# Patient Record
Sex: Male | Born: 1985 | Race: Black or African American | Hispanic: No | Marital: Married | Smoking: Never smoker
Health system: Southern US, Community
[De-identification: ages and names within clinical notes are randomized; demographics above are authoritative.]

## PROBLEM LIST (undated history)

## (undated) DIAGNOSIS — Z789 Other specified health status: Secondary | ICD-10-CM

## (undated) HISTORY — PX: KNEE ARTHROSCOPY: SUR90

---

## 2012-08-05 ENCOUNTER — Emergency Department (HOSPITAL_COMMUNITY): Payer: BC Managed Care – PPO

## 2012-08-05 ENCOUNTER — Encounter (HOSPITAL_COMMUNITY): Payer: Self-pay | Admitting: Emergency Medicine

## 2012-08-05 ENCOUNTER — Observation Stay (HOSPITAL_COMMUNITY)
Admission: EM | Admit: 2012-08-05 | Discharge: 2012-08-06 | Disposition: A | Discharge status: Home / Self Care | Payer: BC Managed Care – PPO | Attending: Internal Medicine | Admitting: Internal Medicine

## 2012-08-05 DIAGNOSIS — R197 Diarrhea, unspecified: Secondary | ICD-10-CM | POA: Diagnosis present

## 2012-08-05 DIAGNOSIS — R109 Unspecified abdominal pain: Secondary | ICD-10-CM

## 2012-08-05 DIAGNOSIS — R112 Nausea with vomiting, unspecified: Secondary | ICD-10-CM | POA: Diagnosis present

## 2012-08-05 DIAGNOSIS — R1115 Cyclical vomiting syndrome unrelated to migraine: Principal | ICD-10-CM | POA: Insufficient documentation

## 2012-08-05 DIAGNOSIS — R209 Unspecified disturbances of skin sensation: Secondary | ICD-10-CM | POA: Insufficient documentation

## 2012-08-05 HISTORY — DX: Other specified health status: Z78.9

## 2012-08-05 LAB — CBC WITH DIFFERENTIAL/PLATELET
Basophils Absolute: 0 10*3/uL (ref 0.0–0.1)
Basophils Relative: 0 % (ref 0–1)
Eosinophils Absolute: 0 10*3/uL (ref 0.0–0.7)
Hemoglobin: 16.3 g/dL (ref 13.0–17.0)
MCH: 31.3 pg (ref 26.0–34.0)
MCHC: 36.1 g/dL — ABNORMAL HIGH (ref 30.0–36.0)
Monocytes Relative: 5 % (ref 3–12)
Neutro Abs: 4.8 10*3/uL (ref 1.7–7.7)
Neutrophils Relative %: 77 % (ref 43–77)
RDW: 13.2 % (ref 11.5–15.5)

## 2012-08-05 LAB — COMPREHENSIVE METABOLIC PANEL
AST: 24 U/L (ref 0–37)
Albumin: 4.7 g/dL (ref 3.5–5.2)
Alkaline Phosphatase: 68 U/L (ref 39–117)
BUN: 14 mg/dL (ref 6–23)
Chloride: 100 mEq/L (ref 96–112)
Creatinine, Ser: 0.91 mg/dL (ref 0.50–1.35)
Potassium: 3.4 mEq/L — ABNORMAL LOW (ref 3.5–5.1)
Total Protein: 8.2 g/dL (ref 6.0–8.3)

## 2012-08-05 LAB — LIPASE, BLOOD: Lipase: 19 U/L (ref 11–59)

## 2012-08-05 MED ORDER — HYDROMORPHONE HCL PF 1 MG/ML IJ SOLN: 1.0000 mg | INTRAMUSCULAR | Status: AC | PRN

## 2012-08-05 MED ORDER — ONDANSETRON HCL 4 MG PO TABS: 4.0000 mg | ORAL_TABLET | Freq: Four times a day (QID) | ORAL | Status: DC | PRN

## 2012-08-05 MED ORDER — ONDANSETRON HCL 4 MG/2ML IJ SOLN: 4.0000 mg | Freq: Four times a day (QID) | INTRAMUSCULAR | Status: DC | PRN

## 2012-08-05 MED ORDER — SODIUM CHLORIDE 0.9 % IV SOLN
INTRAVENOUS | Status: AC
  Administered 2012-08-05: 17:00:00 via INTRAVENOUS

## 2012-08-05 MED ORDER — ONDANSETRON 8 MG PO TBDP
8.0000 mg | ORAL_TABLET | Freq: Once | ORAL | Status: AC
  Administered 2012-08-05: 8 mg via ORAL
  Filled 2012-08-05: qty 1

## 2012-08-05 MED ORDER — ONDANSETRON HCL 4 MG/2ML IJ SOLN: 4.0000 mg | Freq: Three times a day (TID) | INTRAMUSCULAR | Status: DC | PRN

## 2012-08-05 MED ORDER — SODIUM CHLORIDE 0.9 % IV BOLUS (SEPSIS)
1000.0000 mL | Freq: Once | INTRAVENOUS | Status: AC
  Administered 2012-08-05: 1000 mL via INTRAVENOUS

## 2012-08-05 MED ORDER — ENOXAPARIN SODIUM 40 MG/0.4ML ~~LOC~~ SOLN
40.0000 mg | SUBCUTANEOUS | Status: DC
  Administered 2012-08-05: 40 mg via SUBCUTANEOUS
  Filled 2012-08-05 (×2): qty 0.4

## 2012-08-05 MED ORDER — PROMETHAZINE HCL 25 MG PO TABS: 25.0000 mg | ORAL_TABLET | Freq: Four times a day (QID) | ORAL | Status: DC | PRN

## 2012-08-05 MED ORDER — ONDANSETRON HCL 4 MG/2ML IJ SOLN
4.0000 mg | Freq: Once | INTRAMUSCULAR | Status: AC
  Administered 2012-08-05: 4 mg via INTRAVENOUS
  Filled 2012-08-05: qty 2

## 2012-08-05 MED ORDER — OXYCODONE-ACETAMINOPHEN 5-325 MG PO TABS: ORAL_TABLET | ORAL | Status: DC

## 2012-08-05 MED ORDER — IOHEXOL 300 MG/ML  SOLN
100.0000 mL | Freq: Once | INTRAMUSCULAR | Status: AC | PRN
  Administered 2012-08-05: 100 mL via INTRAVENOUS

## 2012-08-05 MED ORDER — PROMETHAZINE HCL 25 MG/ML IJ SOLN
25.0000 mg | Freq: Once | INTRAMUSCULAR | Status: AC
  Administered 2012-08-05: 25 mg via INTRAVENOUS
  Filled 2012-08-05: qty 1

## 2012-08-05 MED ORDER — POTASSIUM CHLORIDE IN NACL 20-0.9 MEQ/L-% IV SOLN
INTRAVENOUS | Status: DC
  Administered 2012-08-05: 18:00:00 via INTRAVENOUS
  Administered 2012-08-06: 100 mL/h via INTRAVENOUS
  Filled 2012-08-05 (×4): qty 1000

## 2012-08-05 MED ORDER — ALBUTEROL SULFATE (5 MG/ML) 0.5% IN NEBU: 2.5000 mg | INHALATION_SOLUTION | RESPIRATORY_TRACT | Status: AC | PRN

## 2012-08-05 MED ORDER — LORAZEPAM 2 MG/ML IJ SOLN
1.0000 mg | Freq: Once | INTRAMUSCULAR | Status: AC
  Administered 2012-08-05: 1 mg via INTRAVENOUS
  Filled 2012-08-05: qty 1

## 2012-08-05 MED ORDER — HYDROMORPHONE HCL PF 1 MG/ML IJ SOLN
1.0000 mg | Freq: Once | INTRAMUSCULAR | Status: AC
  Administered 2012-08-05: 1 mg via INTRAVENOUS
  Filled 2012-08-05: qty 1

## 2012-08-05 MED ORDER — IOHEXOL 300 MG/ML  SOLN
50.0000 mL | Freq: Once | INTRAMUSCULAR | Status: AC | PRN
  Administered 2012-08-05: 50 mL via ORAL

## 2012-08-05 NOTE — ED Notes (Signed)
Pt vomited contrast, CT aware

## 2012-08-05 NOTE — ED Notes (Signed)
Pt drinking contrast. 

## 2012-08-05 NOTE — H&P (Signed)
[facility] History and Physical  Narayan Scull ONG:[id number] DOB: 05/19/1985 DOA: 08/05/2012  Referring physician: Dr. Denton Lank PCP: No primary provider on file.  Specialists: none  Chief Complaint: nausea/vomiting/diarrhea  HPI: Dustin Torres is a 27 y.o. male without past medical history and on no chronic medications presents to the ED with a chief complaint of sudden onset of nausea vomiting abdominal pain and diarrhea that started this morning. He endorses several episodes of vomiting, and he has started noticing at one point streaks of blood. His diarrhea is watery, without any blood. He endorses abdominal pain bilateral lower quadrants. He endorses subjective fevers, and chills diffuse muscle aches and fatigue. Denies any lightheadedness or dizziness, denies any headaches, has no chest pain, shortness of breath, denies any pain with urination, and denies any leg swelling. Denies any sick contacts. He is on city water. Denies any exotic or new foods. In the ED patient underwent a CT scan of the abdomen and pelvis which was unremarkable. Patient was given a trial of by mouth intake in the ED, and was unable to hold anything down, and [facility] was called for admission for intractable nausea vomiting, and severe abdominal pain.  Review of Systems: As per history of present illness, otherwise negative  Past Medical History  Diagnosis Date  . Medical history non-contributory    Past Surgical History  Procedure Laterality Date  . Knee arthroscopy      2 surgeries on the left, 1 surgery on the right   Social History:  reports that he has never smoked. He has never used smokeless tobacco. He reports that he does not drink alcohol or use illicit drugs.  No Known Allergies  Family history - Positive for his sister dying about 6 years ago from heart disease. This was in the setting of pregnancy. He remembers her having an "enlarged heart".  Prior to Admission medications   Medication Sig  Start Date End Date Taking? Authorizing Provider  oxyCODONE-acetaminophen (PERCOCET/ROXICET) 5-325 MG per tablet 1 to 2 tabs PO q6hrs  PRN for pain 08/05/12   Joni Reining Pisciotta, PA-C  promethazine (PHENERGAN) 25 MG tablet Take 1 tablet (25 mg total) by mouth every 6 (six) hours as needed for nausea. 08/05/12   Wynetta Emery, PA-C   Physical Exam: Filed Vitals:   08/05/12 1052 08/05/12 1228 08/05/12 1608  BP: 134/80  130/67  Pulse: 92  65  Temp: 97.7 F (36.5 C) 98.5 F (36.9 C) 97.6 F (36.4 C)  TempSrc: Oral Rectal Oral  Resp: 16  18  SpO2: 100%  100%    General:  In distress due to abdominal pain.  Eyes: PERRL, EOMI, no scleral icterus  ENT: moist oropharynx  Neck: supple, no JVD  Cardiovascular: regular rate without MRG; 2+ peripheral pulses  Respiratory: CTA biL, good air movement without wheezing, rhonchi or crackled  Abdomen: soft, tender to palpation bilateral lower quadrants, positive bowel sounds, no guarding, no rebound  Skin: no rashes  Musculoskeletal: no peripheral edema  Psychiatric: normal mood and affect  Neurologic: CN 2-12 grossly intact, MS 5/5 in all 4  Labs on Admission:  Basic Metabolic Panel:  Recent Labs Lab 08/05/12 1112  NA 137  K 3.4*  CL 100  CO2 17*  GLUCOSE 159*  BUN 14  CREATININE 0.91  CALCIUM 10.0   Liver Function Tests:  Recent Labs Lab 08/05/12 1112  AST 24  ALT 23  ALKPHOS 68  BILITOT 0.5  PROT 8.2  ALBUMIN 4.7    Recent  Labs Lab 08/05/12 1112  LIPASE 19   CBC:  Recent Labs Lab 08/05/12 1112  WBC 6.2  NEUTROABS 4.8  HGB 16.3  HCT 45.1  MCV 86.6  PLT 231   Radiological Exams on Admission: Ct Abdomen Pelvis W Contrast  08/05/2012   *RADIOLOGY REPORT*  Clinical Data: Abdominal pain, nausea, vomiting.  CT ABDOMEN AND PELVIS WITH CONTRAST  Technique:  Multidetector CT imaging of the abdomen and pelvis was performed following the standard protocol during bolus administration of intravenous contrast.   Contrast:  OMNIPAQUE IOHEXOL 300 MG/ML  SOLN  Comparison: None.  Findings: Lung bases are clear.  No effusions.  Heart is normal size.  Liver, gallbladder, spleen, pancreas, adrenals and kidneys are normal.  There is a small amount of free fluid in the pelvis.  Appendix is visualized and is normal.  Stomach, large and small bowel grossly unremarkable.  No free air or adenopathy.  Aorta is normal caliber. No acute bony abnormality.  IMPRESSION: Small amount of free fluid in the pelvis, etiology uncertain.  No visible inflammatory process in the abdomen or pelvis.  Appendix is normal.   Original Report Authenticated By: Charlett Nose, M.D.   Assessment/Plan Active Problems:   Intractable nausea and vomiting   Diarrhea  Abdominal pain with nausea vomiting diarrhea With an uncomplicated CT scan of the abdomen, this is likely viral gastroenteritis. Will admit for observation, IV fluids and potassium repletion. Supportive treatment with antinausea medications and pain control.  DVT prophylaxis Lovenox  Code Status: Presumed full code  Family Communication: Friend in the room  Disposition Plan: Observation, home when medically ready  Time spent: 17  Costin M. Elvera Lennox, MD [facility] Pager [phone]  If 7PM-7AM, please contact night-coverage [email or web address] Password [facility] 08/05/2012, 4:45 PM

## 2012-08-05 NOTE — ED Notes (Signed)
Pt c/o NVD, mid abd pain since about 0730 this morning.  Also c/o numbness and tingling in his extremities.

## 2012-08-05 NOTE — ED Notes (Signed)
Pt to be discharged when fluids are almost complete.

## 2012-08-05 NOTE — Progress Notes (Signed)
Utilization Review completed.  Teosha Casso RN CM  

## 2012-08-05 NOTE — ED Provider Notes (Signed)
History     CSN: [id number]  Arrival date & time 08/05/12  1037   First MD Initiated Contact with Patient 08/05/12 1126      Chief Complaint  Patient presents with  . Nausea  . Emesis  . Diarrhea  . Abdominal Pain  . Numbness    (Consider location/radiation/quality/duration/timing/severity/associated sxs/prior treatment) HPI  Dustin Torres is a 27 y.o. male complaining of acute onset of lower abdominal pain followed by nonbloody, nonbilious, non-coffee-ground emesis and watery diarrhea approximately 8 AM. Associated symptoms of fever, chills, upper extremity paresthesia. Patient rates his pain at 10/10, no exacerbating or alleviating factors identified. Pain started before nausea vomiting or diarrhea. He denies chest pain, shortness of breath, change in bladder habits, or urethral discharge.  History reviewed. No pertinent past medical history.  History reviewed. No pertinent past surgical history.  History reviewed. No pertinent family history.  History  Substance Use Topics  . Smoking status: Never Smoker   . Smokeless tobacco: Not on file  . Alcohol Use: No      Review of Systems  Constitutional: Positive for fever and chills.  Respiratory: Negative for shortness of breath.   Cardiovascular: Negative for chest pain.  Gastrointestinal: Positive for nausea, vomiting, abdominal pain and diarrhea.  All other systems reviewed and are negative.    Allergies  Review of patient's allergies indicates no known allergies.  Home Medications  No current outpatient prescriptions on file.  BP 134/80  Pulse 92  Temp(Src) 97.7 F (36.5 C) (Oral)  Resp 16  SpO2 100%  Physical Exam  Nursing note and vitals reviewed. Constitutional: He is oriented to person, place, and time. He appears well-developed and well-nourished.  Appears acutely ill, eyes closed, Stays very still  HENT:  Head: Normocephalic.  Mouth/Throat: Oropharynx is clear and moist.  Eyes: Conjunctivae  and EOM are normal. Pupils are equal, round, and reactive to light.  Neck: Normal range of motion.  Cardiovascular: Normal rate.   Pulmonary/Chest: Effort normal. No stridor.  Abdominal: Soft. He exhibits no distension and no mass. There is tenderness. There is no rebound and no guarding.  Bowel sounds are present but reduced. Patient has tender to palpation in the right lower and left lower quadrant. Voluntary guarding, no rebound.  Musculoskeletal: Normal range of motion.  Neurological: He is alert and oriented to person, place, and time.  Psychiatric: He has a normal mood and affect.    ED Course  Procedures (including critical care time)  Labs Reviewed  CBC WITH DIFFERENTIAL - Abnormal; Notable for the following:    MCHC 36.1 (*)    All other components within normal limits  COMPREHENSIVE METABOLIC PANEL - Abnormal; Notable for the following:    Potassium 3.4 (*)    CO2 17 (*)    Glucose, Bld 159 (*)    All other components within normal limits  LIPASE, BLOOD   Ct Abdomen Pelvis W Contrast  08/05/2012   *RADIOLOGY REPORT*  Clinical Data: Abdominal pain, nausea, vomiting.  CT ABDOMEN AND PELVIS WITH CONTRAST  Technique:  Multidetector CT imaging of the abdomen and pelvis was performed following the standard protocol during bolus administration of intravenous contrast.  Contrast:  OMNIPAQUE IOHEXOL 300 MG/ML  SOLN  Comparison: None.  Findings: Lung bases are clear.  No effusions.  Heart is normal size.  Liver, gallbladder, spleen, pancreas, adrenals and kidneys are normal.  There is a small amount of free fluid in the pelvis.  Appendix is visualized and is normal.  Stomach, large and small bowel grossly unremarkable.  No free air or adenopathy.  Aorta is normal caliber. No acute bony abnormality.  IMPRESSION: Small amount of free fluid in the pelvis, etiology uncertain.  No visible inflammatory process in the abdomen or pelvis.  Appendix is normal.   Original Report Authenticated  By: Charlett Nose, M.D.    12:25 PM patient seen and examined at the bedside, he is resting comfortably however he is sweating. Patient rates his pain is improved, 4/10 now with reduction in nausea as well.  3:25 PM Fails PO challnege. Pain is 7/10, refuses pain meds.   1. Abdominal pain   2. Nausea vomiting and diarrhea       MDM   Filed Vitals:   08/05/12 1052  BP: 134/80  Pulse: 92  Temp: 97.7 F (36.5 C)  TempSrc: Oral  Resp: 16  SpO2: 100%     [name] is a 27 y.o. male with nausea vomiting diarrhea and abdominal pain. Patient is diffusely tender to palpation no focal abdominal tenderness however he appears very uncomfortable and ordering a CT for this reason. Blood work is unremarkable.  Serial abdominal exams remained nonsurgical.   CT shows small amount of free fluid with no other abnormalities.  Patient has failed a by mouth challenge of discharge. It is recent bring him in for intractable vomiting.  Patient will be admitted to a MedSurg bed for observation under care of Dr. Lafe Garin.    Medications  promethazine (PHENERGAN) injection 25 mg (not administered)  0.9 %  sodium chloride infusion (not administered)  ondansetron (ZOFRAN) injection 4 mg (not administered)  HYDROmorphone (DILAUDID) injection 1 mg (not administered)  albuterol (PROVENTIL) (5 MG/ML) 0.5% nebulizer solution 2.5 mg (not administered)  ondansetron (ZOFRAN-ODT) disintegrating tablet 8 mg (8 mg Oral Given 08/05/12 1102)  ondansetron (ZOFRAN) injection 4 mg (4 mg Intravenous Given 08/05/12 1156)  sodium chloride 0.9 % bolus 1,000 mL (0 mLs Intravenous Stopped 08/05/12 1253)  HYDROmorphone (DILAUDID) injection 1 mg (1 mg Intravenous Given 08/05/12 1156)  iohexol (OMNIPAQUE) 300 MG/ML solution 50 mL (50 mLs Oral Contrast Given 08/05/12 1251)  iohexol (OMNIPAQUE) 300 MG/ML solution 100 mL (100 mLs Intravenous Contrast Given 08/05/12 1409)  LORazepam (ATIVAN) injection 1 mg (1 mg Intravenous Given  08/05/12 1533)  sodium chloride 0.9 % bolus 1,000 mL (1,000 mLs Intravenous New Bag/Given 08/05/12 1535)     Wynetta Emery, PA-C 08/05/12 1619

## 2012-08-06 LAB — BASIC METABOLIC PANEL
CO2: 26 mEq/L (ref 19–32)
Calcium: 8.8 mg/dL (ref 8.4–10.5)
Creatinine, Ser: 1.03 mg/dL (ref 0.50–1.35)
GFR calc Af Amer: >90 mL/min (ref >90)
GFR calc non Af Amer: >90 mL/min (ref >90)

## 2012-08-06 LAB — CBC
MCH: 29.3 pg (ref 26.0–34.0)
MCHC: 33.3 g/dL (ref 30.0–36.0)
MCV: 88.2 fL (ref 78.0–100.0)
Platelets: 187 10*3/uL (ref 150–400)
RDW: 13.9 % (ref 11.5–15.5)

## 2012-08-06 MED ORDER — PROMETHAZINE HCL 25 MG PO TABS: 25.0000 mg | ORAL_TABLET | Freq: Four times a day (QID) | ORAL | Status: DC | PRN

## 2012-08-06 MED ORDER — OXYCODONE-ACETAMINOPHEN 5-325 MG PO TABS: ORAL_TABLET | ORAL | Status: DC

## 2012-08-06 NOTE — ED Provider Notes (Signed)
Medical screening examination/treatment/procedure(s) were performed by non-physician practitioner and as supervising physician I was immediately available for consultation/collaboration.   Annaleigha Woo E Glendal Cassaday, MD 08/06/12 1107 

## 2012-08-06 NOTE — Discharge Summary (Signed)
Physician Discharge Summary  Dustin Torres HQI:[id number] DOB: 27-Feb-1986 DOA: 08/05/2012  PCP: No primary provider on file.  Admit date: 08/05/2012 Discharge date: 08/06/2012  Recommendations for Outpatient Follow-up:  1. Pt will need to follow up with PCP in 2-3 weeks post discharge 2. Please obtain BMP to evaluate electrolytes and kidney function 3. Please also check CBC to evaluate Hg and Hct levels  Discharge Diagnoses:  Active Problems:   Intractable nausea and vomiting   Diarrhea   Discharge Condition: Stable  Diet recommendation: Heart healthy diet discussed in details   History of present illness:  Dustin Torres is a 27 y.o. male without past medical history and on no chronic medications presents to the ED with a chief complaint of sudden onset of nausea vomiting abdominal pain and diarrhea that started the morning of admission. He endorses several episodes of vomiting, and he has started noticing at one point streaks of blood. His diarrhea is watery, without any blood. He endorses abdominal pain, bilateral lower quadrants. He endorses subjective fevers, and chills diffuse muscle aches and fatigue. Denies any lightheadedness or dizziness, denies any headaches, has no chest pain, shortness of breath, denies any pain with urination, and denies any leg swelling. Denies any sick contacts. He is on city water. Denies any exotic or new foods. In the ED patient underwent a CT scan of the abdomen and pelvis which was unremarkable. Patient was given a trial of by mouth intake in the ED, and was unable to hold anything down, and [facility] was called for admission for intractable nausea vomiting, and severe abdominal pain.   Hospital Course:  Active Problems:   Intractable nausea and vomiting   Diarrhea - likely secondary to acute viral gastroenteritis - pt feeling better this am, toleration PO intake well - all electrolytes stable and within normal limits - pt wants to be discharged home    Procedures/Studies: Ct Abdomen Pelvis W Contrast  08/05/2012   *RADIOLOGY REPORT*  Clinical Data: Abdominal pain, nausea, vomiting.  CT ABDOMEN AND PELVIS WITH CONTRAST  Technique:  Multidetector CT imaging of the abdomen and pelvis was performed following the standard protocol during bolus administration of intravenous contrast.  Contrast:  OMNIPAQUE IOHEXOL 300 MG/ML  SOLN  Comparison: None.  Findings: Lung bases are clear.  No effusions.  Heart is normal size.  Liver, gallbladder, spleen, pancreas, adrenals and kidneys are normal.  There is a small amount of free fluid in the pelvis.  Appendix is visualized and is normal.  Stomach, large and small bowel grossly unremarkable.  No free air or adenopathy.  Aorta is normal caliber. No acute bony abnormality.  IMPRESSION: Small amount of free fluid in the pelvis, etiology uncertain.  No visible inflammatory process in the abdomen or pelvis.  Appendix is normal.   Original Report Authenticated By: Charlett Nose, M.D.   Consultations:  None  Antibiotics:  None  Discharge Exam: Filed Vitals:   08/06/12 0623  BP: 126/65  Pulse: 78  Temp: 98.5 F (36.9 C)  Resp: 18   Filed Vitals:   08/05/12 1608 08/05/12 1730 08/05/12 2120 08/06/12 0623  BP: 130/67 143/74 127/59 126/65  Pulse: 65 72 76 78  Temp: 97.6 F (36.4 C) 98 F (36.7 C) 99 F (37.2 C) 98.5 F (36.9 C)  TempSrc: Oral Oral Oral Oral  Resp: 18 20 18 18   Height:  6' 4.5" (1.943 m)    Weight:  122.471 kg (270 lb)    SpO2: 100% 93% 100% 100%  General: Pt is alert, follows commands appropriately, not in acute distress Cardiovascular: Regular rate and rhythm, S1/S2 +, no murmurs, no rubs, no gallops Respiratory: Clear to auscultation bilaterally, no wheezing, no crackles, no rhonchi Abdominal: Soft, non tender, non distended, bowel sounds +, no guarding Extremities: no edema, no cyanosis, pulses palpable bilaterally DP and PT Neuro: Grossly nonfocal  Discharge  Instructions  Discharge Orders   Future Orders Complete By Expires     Diet - low sodium heart healthy  As directed     Increase activity slowly  As directed         Medication List    TAKE these medications       oxyCODONE-acetaminophen 5-325 MG per tablet  Commonly known as:  PERCOCET/ROXICET  1 to 2 tabs PO q6hrs  PRN for pain     promethazine 25 MG tablet  Commonly known as:  PHENERGAN  Take 1 tablet (25 mg total) by mouth every 6 (six) hours as needed for nausea.           Follow-up Information   Follow up with [facility] (If symptoms worsen)    Contact information:   [address] [id number] [place] [address] [phone]      Follow up with [facility].   Contact information:   [address] [id number] [place] [address] [phone]       The results of significant diagnostics from this hospitalization (including imaging, microbiology, ancillary and laboratory) are listed below for reference.     Microbiology: No results found for this or any previous visit (from the past 240 hour(s)).   Labs: Basic Metabolic Panel:  Recent Labs Lab 08/05/12 1112 08/06/12 0547  NA 137 138  K 3.4* 3.6  CL 100 104  CO2 17* 26  GLUCOSE 159* 97  BUN 14 9  CREATININE 0.91 1.03  CALCIUM 10.0 8.8   Liver Function Tests:  Recent Labs Lab 08/05/12 1112  AST 24  ALT 23  ALKPHOS 68  BILITOT 0.5  PROT 8.2  ALBUMIN 4.7    Recent Labs Lab 08/05/12 1112  LIPASE 19   No results found for this basename: AMMONIA,  in the last 168 hours CBC:  Recent Labs Lab 08/05/12 1112 08/06/12 0547  WBC 6.2 8.4  NEUTROABS 4.8  --   HGB 16.3 13.4  HCT 45.1 40.3  MCV 86.6 88.2  PLT 231 187   SIGNED: Time coordinating discharge: Over 30 minutes  Debbora Presto, MD  [facility] 08/06/2012, 12:52 PM Pager [phone]  If 7PM-7AM, please contact  night-coverage [email or web address] Password [email or web address]

## 2012-08-06 NOTE — Progress Notes (Signed)
Nutrition Brief Note  Patient identified on the Malnutrition Screening Tool (MST) Report  Body mass index is 32.44 kg/(m^2). Patient meets criteria for class I obesity based on current BMI.   Current diet order is regular which will start at lunch, patient consuming >50% of clear liquid breakfast tray with no nausea this morning, expect pt will eat well on regular diet at lunch. Labs and medications reviewed. Met with pt who reports having nausea and vomiting that started yesterday, reports having at least 10-15 episodes of vomiting. Pt denies eating anything out of the ordinary. Pt reports good appetite and following a healthy diet PTA. Pt reports he has gradually lost over 100 pounds in the past 4 years since graduating from college by working out. Pt denies any nausea/vomiting today and tolerated clear liquid diet. Encouraged pt to consume bland foods on regular diet.   No nutrition interventions warranted at this time. If nutrition issues arise, please consult RD.   Levon Hedger MS, RD, LDN [phone] Pager [phone] After Hours Pager

## 2013-01-03 ENCOUNTER — Emergency Department (HOSPITAL_COMMUNITY)
Admission: EM | Admit: 2013-01-03 | Discharge: 2013-01-03 | Disposition: A | Discharge status: Home / Self Care | Payer: BC Managed Care – PPO | Attending: Emergency Medicine | Admitting: Emergency Medicine

## 2013-01-03 ENCOUNTER — Encounter (HOSPITAL_COMMUNITY): Payer: Self-pay | Admitting: Emergency Medicine

## 2013-01-03 DIAGNOSIS — A084 Viral intestinal infection, unspecified: Secondary | ICD-10-CM

## 2013-01-03 DIAGNOSIS — R42 Dizziness and giddiness: Secondary | ICD-10-CM | POA: Insufficient documentation

## 2013-01-03 DIAGNOSIS — A088 Other specified intestinal infections: Secondary | ICD-10-CM | POA: Insufficient documentation

## 2013-01-03 LAB — DIFFERENTIAL
Lymphs Abs: 0.7 10*3/uL (ref 0.7–4.0)
Monocytes Relative: 4 % (ref 3–12)
Neutro Abs: 4.9 10*3/uL (ref 1.7–7.7)
Neutrophils Relative %: 84 % — ABNORMAL HIGH (ref 43–77)

## 2013-01-03 LAB — COMPREHENSIVE METABOLIC PANEL
ALT: 17 U/L (ref 0–53)
Calcium: 9.4 mg/dL (ref 8.4–10.5)
GFR calc Af Amer: >90 mL/min (ref >90)
Glucose, Bld: 135 mg/dL — ABNORMAL HIGH (ref 70–99)
Sodium: 137 mEq/L (ref 135–145)
Total Protein: 7.4 g/dL (ref 6.0–8.3)

## 2013-01-03 LAB — CBC
Hemoglobin: 14.8 g/dL (ref 13.0–17.0)
MCH: 30.1 pg (ref 26.0–34.0)
RBC: 4.91 MIL/uL (ref 4.22–5.81)

## 2013-01-03 MED ORDER — MORPHINE SULFATE 4 MG/ML IJ SOLN
4.0000 mg | Freq: Once | INTRAMUSCULAR | Status: AC
  Administered 2013-01-03: 4 mg via INTRAVENOUS
  Filled 2013-01-03: qty 1

## 2013-01-03 MED ORDER — ONDANSETRON HCL 4 MG/2ML IJ SOLN
4.0000 mg | Freq: Once | INTRAMUSCULAR | Status: AC
  Administered 2013-01-03: 4 mg via INTRAVENOUS
  Filled 2013-01-03: qty 2

## 2013-01-03 MED ORDER — ONDANSETRON HCL 4 MG PO TABS: 4.0000 mg | ORAL_TABLET | Freq: Four times a day (QID) | ORAL | Status: DC

## 2013-01-03 MED ORDER — SODIUM CHLORIDE 0.9 % IV BOLUS (SEPSIS)
1000.0000 mL | Freq: Once | INTRAVENOUS | Status: AC
  Administered 2013-01-03: 1000 mL via INTRAVENOUS

## 2013-01-03 MED ORDER — SODIUM CHLORIDE 0.9 % IV SOLN
1000.0000 mL | Freq: Once | INTRAVENOUS | Status: AC
  Administered 2013-01-03: 1000 mL via INTRAVENOUS

## 2013-01-03 MED ORDER — METOCLOPRAMIDE HCL 5 MG/ML IJ SOLN
10.0000 mg | Freq: Once | INTRAMUSCULAR | Status: AC
  Administered 2013-01-03: 10 mg via INTRAVENOUS
  Filled 2013-01-03: qty 2

## 2013-01-03 MED ORDER — PROMETHAZINE HCL 25 MG/ML IJ SOLN
12.5000 mg | Freq: Four times a day (QID) | INTRAMUSCULAR | Status: DC | PRN
  Administered 2013-01-03: 12.5 mg via INTRAVENOUS
  Filled 2013-01-03: qty 1

## 2013-01-03 NOTE — ED Provider Notes (Signed)
Medical screening examination/treatment/procedure(s) were performed by non-physician practitioner and as supervising physician I was immediately available for consultation/collaboration.    Atanacio Melnyk, MD 01/03/13 2154 

## 2013-01-03 NOTE — ED Notes (Signed)
He states that he has vomited several times this morning; "and I feel real cold".  He is in no distress.  An adult male accompanies him.

## 2013-01-03 NOTE — ED Provider Notes (Signed)
CSN: [id number]     Arrival date & time 01/03/13  1322 History   First MD Initiated Contact with Patient 01/03/13 1325     Chief Complaint  Patient presents with  . Emesis   (Consider location/radiation/quality/duration/timing/severity/associated sxs/prior Treatment) HPI Dustin Torres is a 27 y.o. male who presents emergency department with complaint of nausea, vomiting, diarrhea, abdominal pain, cold chills. Patient states his symptoms began this morning when he woke up. Patient reports that he has had over 10 episodes of emesis, few episodes of watery stools, and having lower abdominal cramping. Patient denies any recent travel, denies any fever, no blood in his stool or his emesis. Patient tried Pepto-Bismol prior to coming in. Patient reports similar episode several months ago for which he ended up being hospitalized for intractable vomiting. Patient denies anyone in the household with the same symptoms. He denies any prior abdominal surgeries.  Past Medical History  Diagnosis Date  . Medical history non-contributory    Past Surgical History  Procedure Laterality Date  . Knee arthroscopy      2 surgeries on the left, 1 surgery on the right   History reviewed. No pertinent family history. History  Substance Use Topics  . Smoking status: Never Smoker   . Smokeless tobacco: Never Used  . Alcohol Use: No    Review of Systems  Constitutional: Positive for chills. Negative for fever.  Respiratory: Negative for cough, chest tightness and shortness of breath.   Cardiovascular: Negative for chest pain, palpitations and leg swelling.  Gastrointestinal: Positive for nausea, vomiting, abdominal pain and diarrhea. Negative for abdominal distention.  Genitourinary: Negative for dysuria, urgency, frequency, hematuria and flank pain.  Musculoskeletal: Negative for arthralgias, myalgias, neck pain and neck stiffness.  Skin: Negative for rash.  Allergic/Immunologic: Negative for  immunocompromised state.  Neurological: Positive for light-headedness. Negative for dizziness, weakness, numbness and headaches.    Allergies  Review of patient's allergies indicates no known allergies.  Home Medications   Current Outpatient Rx  Name  Route  Sig  Dispense  Refill  . bismuth subsalicylate (PEPTO BISMOL) 262 MG chewable tablet   Oral   Chew 524 mg by mouth as needed for indigestion.          BP 146/83  Pulse 67  Temp(Src) 97.9 F (36.6 C) (Oral)  Resp 18  SpO2 100% Physical Exam  Nursing note and vitals reviewed. Constitutional: He appears well-developed and well-nourished.  Uncomfortable appearing, laying with his eyes closed, refuses to move  HENT:  Head: Normocephalic and atraumatic.  Eyes: Conjunctivae are normal.  Neck: Neck supple.  Cardiovascular: Normal rate, regular rhythm and normal heart sounds.   Pulmonary/Chest: Effort normal. No respiratory distress. He has no wheezes. He has no rales.  Abdominal: Soft. Bowel sounds are normal. He exhibits no distension. There is tenderness. There is guarding. There is no rebound.  Diffuse tenderness with mild guarding.  Musculoskeletal: He exhibits no edema.  Neurological: He is alert.  Skin: Skin is warm and dry.    ED Course  Procedures (including critical care time) Labs Review Labs Reviewed  CBC WITH DIFFERENTIAL  COMPREHENSIVE METABOLIC PANEL  LIPASE, BLOOD   Imaging Review No results found.  EKG Interpretation   None       MDM   1. Viral gastroenteritis     Sent with nausea, vomiting, diarrhea, diffuse abdominal tenderness. Chills. He received 4 mg of morphine IV, Reglan, with good relief of nausea and pain. He stated that he still had  a lobe of nausea Phenergan as ordered. He is going to try to drink some ice chest. There is a delay in blood work, it's pending. I do not think at this time on reassessment patient had a surgical abdomen. I do not think any imaging indicated at this  time. Will reassess after oral trial and blood work.   Lottie Mussel, PA-C 01/04/13 2042

## 2013-01-03 NOTE — ED Provider Notes (Signed)
Patient signed out to me by Kirichenko, PA-C.  Patient presents with nausea, vomiting, and diarrhea.    Labs are reassuring.  Results for orders placed during the hospital encounter of 01/03/13  COMPREHENSIVE METABOLIC PANEL      Result Value Range   Sodium 137  135 - 145 mEq/L   Potassium 3.9  3.5 - 5.1 mEq/L   Chloride 101  96 - 112 mEq/L   CO2 22  19 - 32 mEq/L   Glucose, Bld 135 (*) 70 - 99 mg/dL   BUN 10  6 - 23 mg/dL   Creatinine, Ser 1.61  0.50 - 1.35 mg/dL   Calcium 9.4  8.4 - 09.6 mg/dL   Total Protein 7.4  6.0 - 8.3 g/dL   Albumin 4.5  3.5 - 5.2 g/dL   AST 23  0 - 37 U/L   ALT 17  0 - 53 U/L   Alkaline Phosphatase 56  39 - 117 U/L   Total Bilirubin 0.5  0.3 - 1.2 mg/dL   GFR calc non Af Amer >90  >90 mL/min   GFR calc Af Amer >90  >90 mL/min  LIPASE, BLOOD      Result Value Range   Lipase 13  11 - 59 U/L  CBC      Result Value Range   WBC 5.9  4.0 - 10.5 K/uL   RBC 4.91  4.22 - 5.81 MIL/uL   Hemoglobin 14.8  13.0 - 17.0 g/dL   HCT 04.5  40.9 - 81.1 %   MCV 89.0  78.0 - 100.0 fL   MCH 30.1  26.0 - 34.0 pg   MCHC 33.9  30.0 - 36.0 g/dL   RDW 91.4  78.2 - 95.6 %   Platelets 229  150 - 400 K/uL  DIFFERENTIAL      Result Value Range   Neutrophils Relative % 84 (*) 43 - 77 %   Neutro Abs 4.9  1.7 - 7.7 K/uL   Lymphocytes Relative 12  12 - 46 %   Lymphs Abs 0.7  0.7 - 4.0 K/uL   Monocytes Relative 4  3 - 12 %   Monocytes Absolute 0.2  0.1 - 1.0 K/uL   Eosinophils Relative 0  0 - 5 %   Eosinophils Absolute 0.0  0.0 - 0.7 K/uL   Basophils Relative 0  0 - 1 %   Basophils Absolute 0.0  0.0 - 0.1 K/uL   Plan is to recheck abdomen.  If benign, discharge to home with strict return precautions.  5:06 PM PE: Gen: A&O x4 HEENT: PERRL, EOM intact CHEST: RRR, no m/r/g LUNGS: CTAB, no w/r/r ABD: BS x 4, ND/NT, abdomen is benign, no focal tenderness or signs of acute abdomen EXT: No edema, strong peripheral pulses NEURO: Sensation and strength intact  bilaterally   Will discharge to home.  Return precautions are given.  Roxy Horseman, PA-C 01/03/13 1706

## 2013-01-07 NOTE — ED Provider Notes (Signed)
Medical screening examination/treatment/procedure(s) were performed by non-physician practitioner and as supervising physician I was immediately available for consultation/collaboration.  Flint Melter, MD 01/07/13 1626

## 2014-12-21 IMAGING — CT CT ABD-PELV W/ CM
1 series · 16 of 32 positions shown, 20 images · IV contrast (OMNIPAQUE 300)
Comparison: None.

CLINICAL DATA: Abdominal pain, nausea, vomiting.

CT ABDOMEN AND PELVIS WITH CONTRAST
TECHNIQUE: Multidetector CT imaging of the abdomen and pelvis was
performed following the standard protocol during bolus
administration of intravenous contrast.
Contrast:  100mL OMNIPAQUE IOHEXOL 300 MG/ML  SOLN

[Series 2: abd/pel with · axial · 0.74mm/px · z∈[+1179,+1619]mm · 16 of 99 slices shown, 20 images]
[im 7/99  soft-tissue]
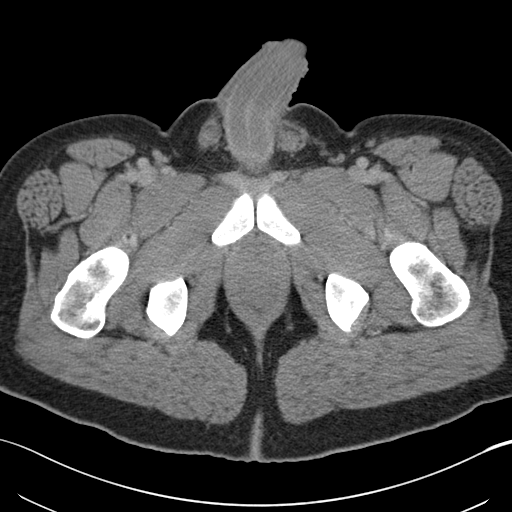
[im 7/99  bone]
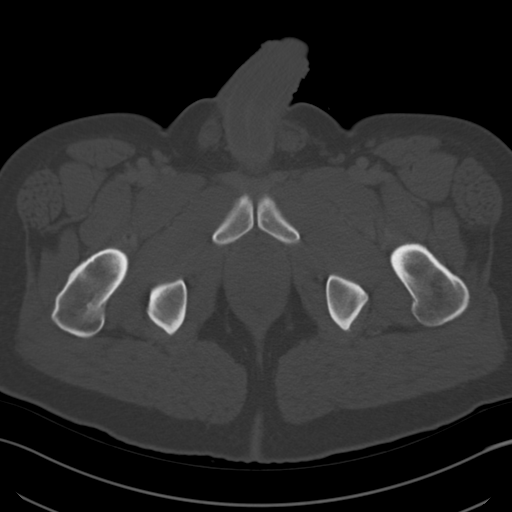
[im 13/99  soft-tissue]
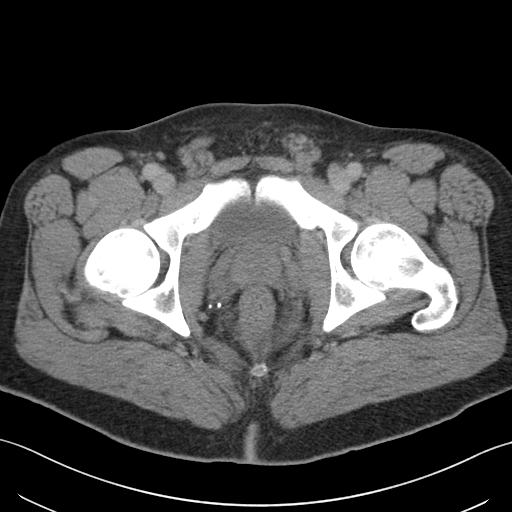
[im 19/99  soft-tissue]
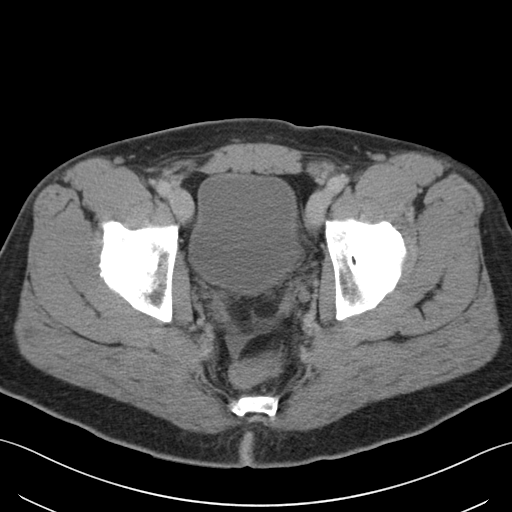
[im 26/99  soft-tissue]
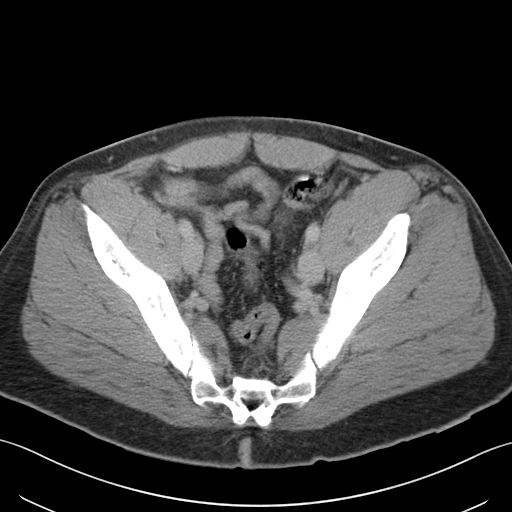
[im 32/99  soft-tissue]
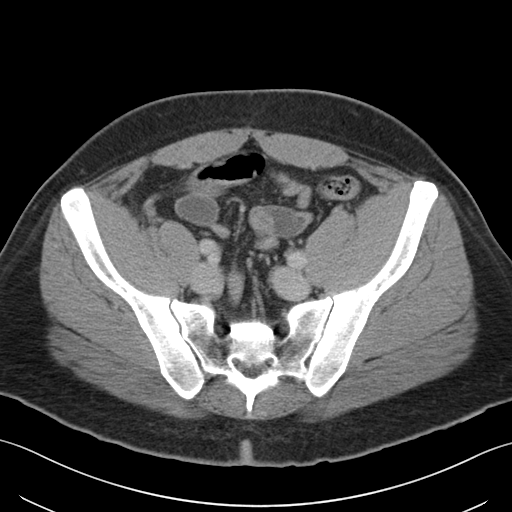
[im 38/99  soft-tissue]
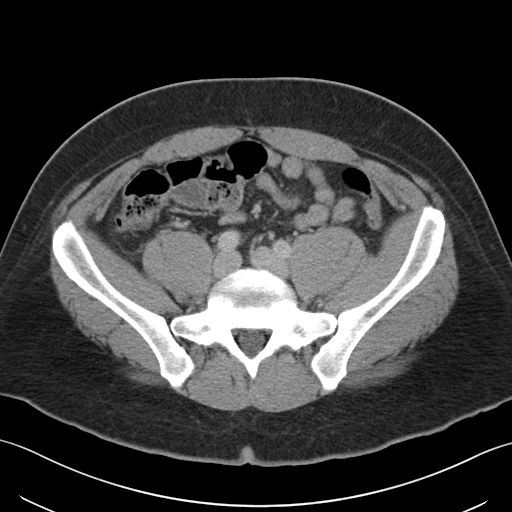
[im 45/99  soft-tissue]
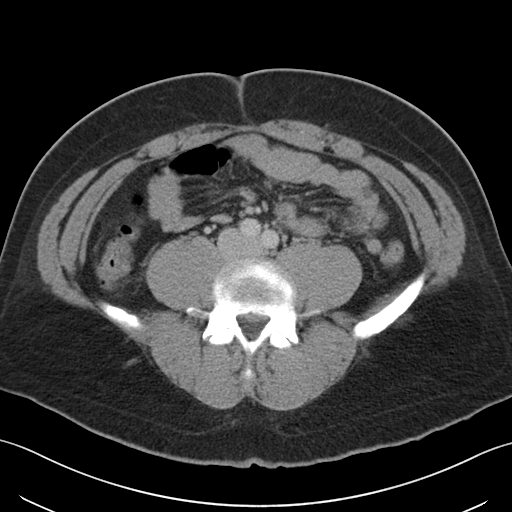
[im 54/99  soft-tissue]
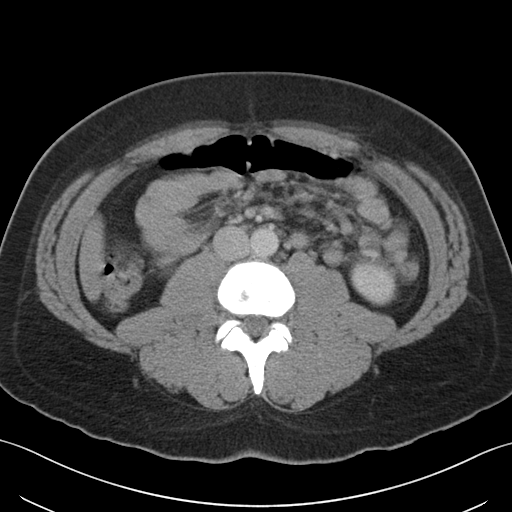
[im 61/99  soft-tissue]
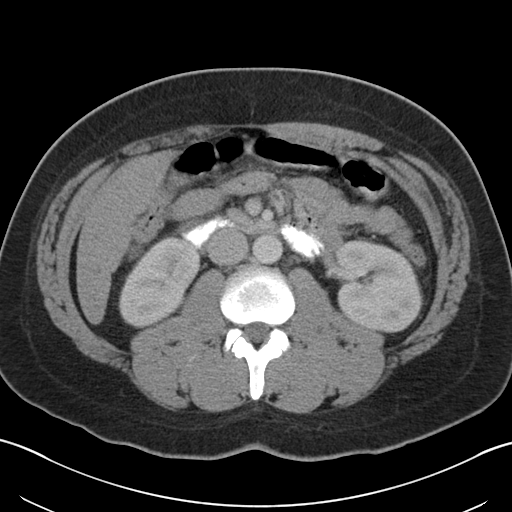
[im 61/99  bone]
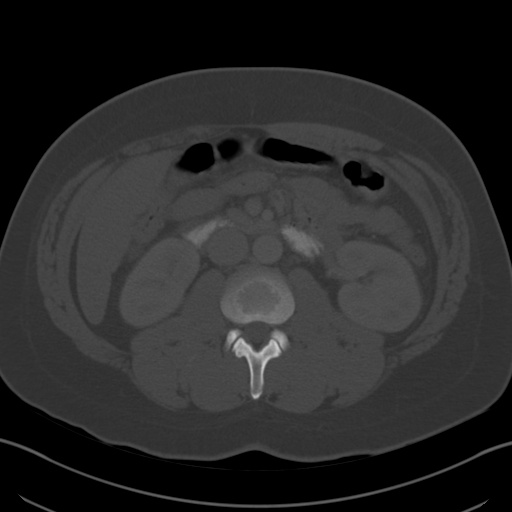
[im 67/99  soft-tissue]
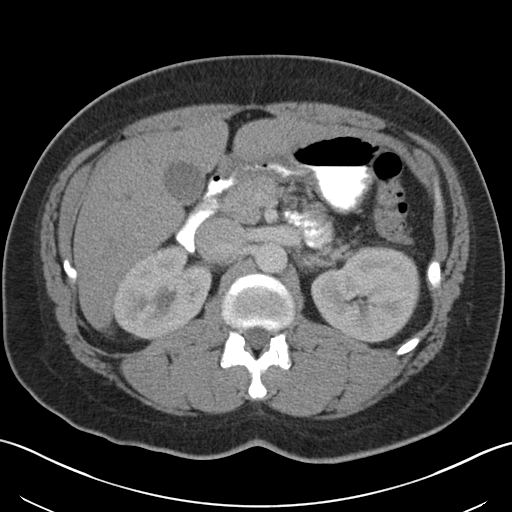
[im 73/99  soft-tissue]
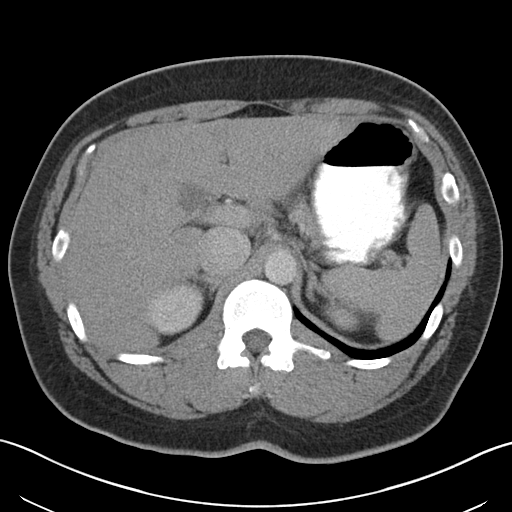
[im 80/99  soft-tissue]
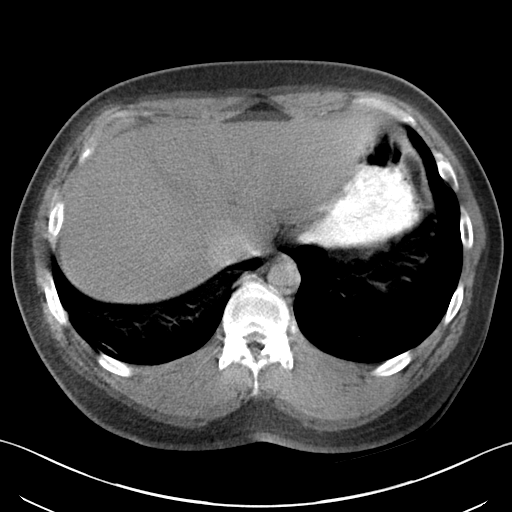
[im 86/99  soft-tissue]
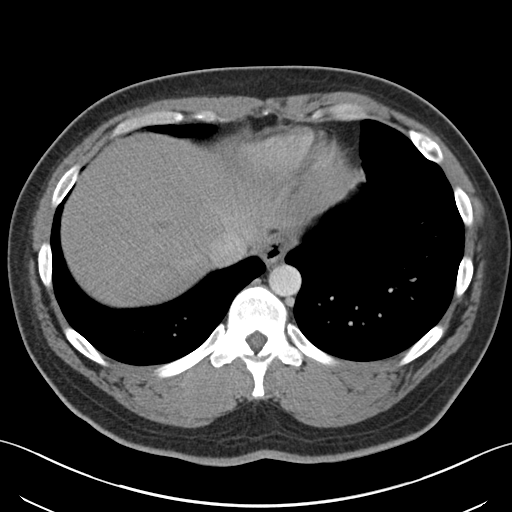
[im 86/99  lung]
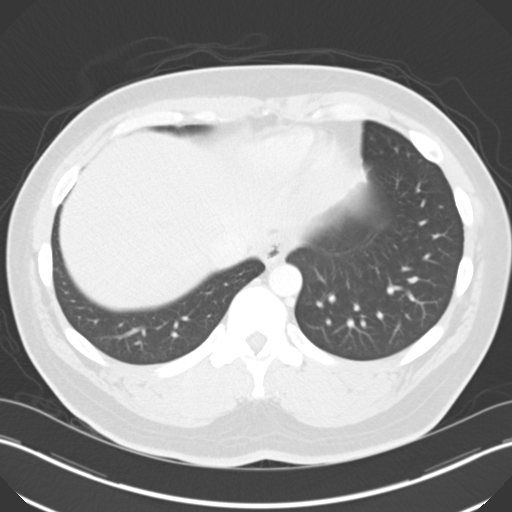
[im 89/99  lung]
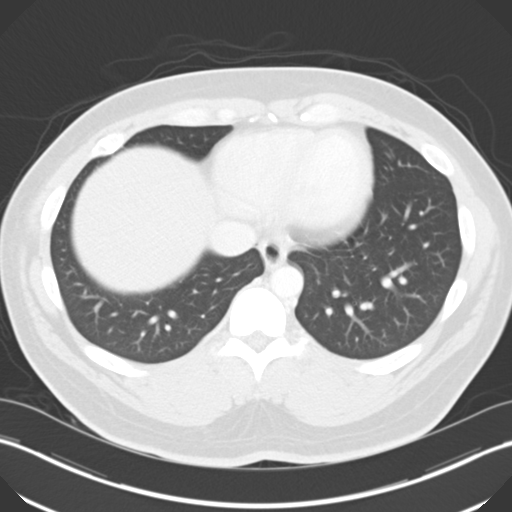
[im 92/99  soft-tissue]
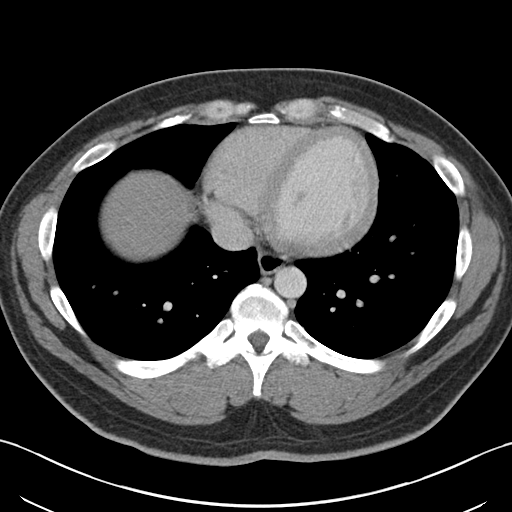
[im 92/99  lung]
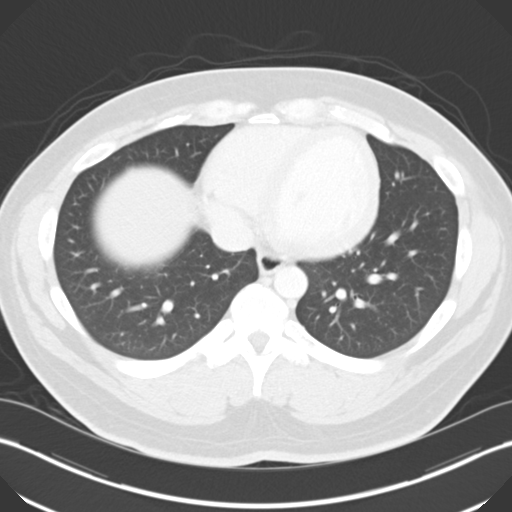
[im 95/99  lung]
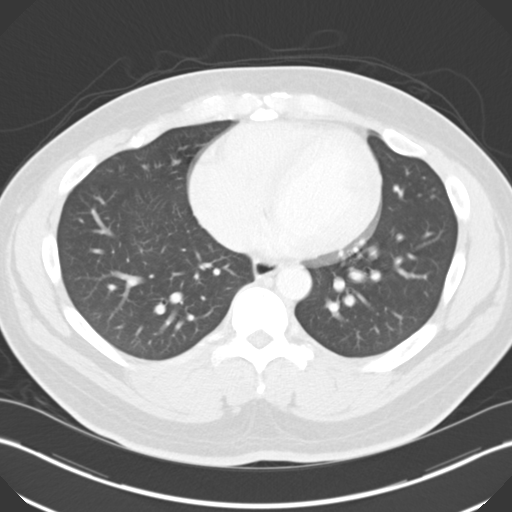

[16 of 32 positions shown; findings below may reference images not displayed]

FINDINGS: Lung bases are clear.  No effusions.  Heart is normal
size.

Liver, gallbladder, spleen, pancreas, adrenals and kidneys are
normal.

There is a small amount of free fluid in the pelvis.  Appendix is
visualized and is normal.  Stomach, large and small bowel grossly
unremarkable.  No free air or adenopathy.  Aorta is normal caliber.
No acute bony abnormality.
IMPRESSION: Small amount of free fluid in the pelvis, etiology uncertain.  No
visible inflammatory process in the abdomen or pelvis.  Appendix is
normal.

## 2015-09-09 ENCOUNTER — Encounter (HOSPITAL_COMMUNITY): Payer: Self-pay | Admitting: Emergency Medicine

## 2015-09-09 ENCOUNTER — Emergency Department (HOSPITAL_COMMUNITY)
Admission: EM | Admit: 2015-09-09 | Discharge: 2015-09-09 | Disposition: A | Discharge status: Home / Self Care | Payer: Self-pay | Attending: Emergency Medicine | Admitting: Emergency Medicine

## 2015-09-09 DIAGNOSIS — R197 Diarrhea, unspecified: Secondary | ICD-10-CM | POA: Insufficient documentation

## 2015-09-09 DIAGNOSIS — R112 Nausea with vomiting, unspecified: Secondary | ICD-10-CM | POA: Insufficient documentation

## 2015-09-09 LAB — URINALYSIS, ROUTINE W REFLEX MICROSCOPIC
Bilirubin Urine: NEGATIVE
Glucose, UA: NEGATIVE mg/dL
Hgb urine dipstick: NEGATIVE
Ketones, ur: >80 mg/dL — AB
LEUKOCYTES UA: NEGATIVE
NITRITE: NEGATIVE
PH: 7.5 (ref 5.0–8.0)
Protein, ur: NEGATIVE mg/dL
SPECIFIC GRAVITY, URINE: 1.029 (ref 1.005–1.030)

## 2015-09-09 LAB — COMPREHENSIVE METABOLIC PANEL
ALBUMIN: 4.8 g/dL (ref 3.5–5.0)
ALT: 25 U/L (ref 17–63)
ANION GAP: 11 (ref 5–15)
AST: 27 U/L (ref 15–41)
Alkaline Phosphatase: 53 U/L (ref 38–126)
BILIRUBIN TOTAL: 0.9 mg/dL (ref 0.3–1.2)
BUN: 14 mg/dL (ref 6–20)
CO2: 21 mmol/L — ABNORMAL LOW (ref 22–32)
Calcium: 9.4 mg/dL (ref 8.9–10.3)
Chloride: 103 mmol/L (ref 101–111)
Creatinine, Ser: 1.07 mg/dL (ref 0.61–1.24)
GLUCOSE: 153 mg/dL — AB (ref 65–99)
POTASSIUM: 3.9 mmol/L (ref 3.5–5.1)
Sodium: 135 mmol/L (ref 135–145)
TOTAL PROTEIN: 7.5 g/dL (ref 6.5–8.1)

## 2015-09-09 LAB — CBC
HEMATOCRIT: 43.8 % (ref 39.0–52.0)
HEMOGLOBIN: 15.9 g/dL (ref 13.0–17.0)
MCH: 31 pg (ref 26.0–34.0)
MCHC: 36.3 g/dL — ABNORMAL HIGH (ref 30.0–36.0)
MCV: 85.4 fL (ref 78.0–100.0)
Platelets: 210 10*3/uL (ref 150–400)
RBC: 5.13 MIL/uL (ref 4.22–5.81)
RDW: 13.3 % (ref 11.5–15.5)
WBC: 6.1 10*3/uL (ref 4.0–10.5)

## 2015-09-09 LAB — LIPASE, BLOOD: Lipase: 19 U/L (ref 11–51)

## 2015-09-09 MED ORDER — SODIUM CHLORIDE 0.9 % IV BOLUS (SEPSIS)
1000.0000 mL | Freq: Once | INTRAVENOUS | Status: AC
Start: 2015-09-09 — End: 2015-09-09
  Administered 2015-09-09: 1000 mL via INTRAVENOUS

## 2015-09-09 MED ORDER — SODIUM CHLORIDE 0.9 % IV SOLN: 8.0000 mg | Freq: Once | INTRAVENOUS | Status: DC

## 2015-09-09 MED ORDER — METOCLOPRAMIDE HCL 10 MG PO TABS: 10.0000 mg | ORAL_TABLET | Freq: Four times a day (QID) | ORAL | Status: AC

## 2015-09-09 MED ORDER — PROMETHAZINE HCL 25 MG/ML IJ SOLN
25.0000 mg | Freq: Once | INTRAMUSCULAR | Status: AC
  Administered 2015-09-09: 25 mg via INTRAVENOUS
  Filled 2015-09-09: qty 1

## 2015-09-09 MED ORDER — LOPERAMIDE HCL 2 MG PO CAPS: 2.0000 mg | ORAL_CAPSULE | Freq: Four times a day (QID) | ORAL | Status: AC | PRN

## 2015-09-09 MED ORDER — METOCLOPRAMIDE HCL 5 MG/ML IJ SOLN
10.0000 mg | Freq: Once | INTRAMUSCULAR | Status: AC
  Administered 2015-09-09: 10 mg via INTRAVENOUS
  Filled 2015-09-09: qty 2

## 2015-09-09 NOTE — ED Notes (Signed)
Pt was informed about Urinalysis 

## 2015-09-09 NOTE — ED Notes (Signed)
Bed: [id number] Expected date: 09/09/15 Expected time: 8:30 AM Means of arrival:  Comments: n/v

## 2015-09-09 NOTE — ED Notes (Signed)
MD at bedside. 

## 2015-09-09 NOTE — ED Provider Notes (Signed)
CSN: [id number]     Arrival date & time 09/09/15  0840 History   First MD Initiated Contact with Patient 09/09/15 [phone]     Chief Complaint  Patient presents with  . Emesis     (Consider location/radiation/quality/duration/timing/severity/associated sxs/prior Treatment) HPI 30 year old male who presents with nausea, vomiting, and diarrhea. He is otherwise healthy, without abdominal surgeries. States that he has been in his usual state of health, went to bed as his normal self. This morning woke up with intractable nonbilious, nonbloody emesis of 7-8 episodes. Also has had 3 episodes of diarrhea. Has had intermittent abdominal cramping associated with vomiting episodes. Has not had fevers, chills, known sick contacts, or known food exposures. Per nursing note, patient's wife did have similar symptoms 2 weeks ago Past Medical History  Diagnosis Date  . Medical history non-contributory    Past Surgical History  Procedure Laterality Date  . Knee arthroscopy      2 surgeries on the left, 1 surgery on the right   History reviewed. No pertinent family history. Social History  Substance Use Topics  . Smoking status: Never Smoker   . Smokeless tobacco: Never Used  . Alcohol Use: No    Review of Systems 10/14 systems reviewed and are negative other than those stated in the HPI    Allergies  Review of patient's allergies indicates no known allergies.  Home Medications   Prior to Admission medications   Medication Sig Start Date End Date Taking? Authorizing Provider  loperamide (IMODIUM) 2 MG capsule Take 1 capsule (2 mg total) by mouth 4 (four) times daily as needed for diarrhea or loose stools. 09/09/15   Lavera Guiseana Duo Bryson Palen, MD  metoCLOPramide (REGLAN) 10 MG tablet Take 1 tablet (10 mg total) by mouth every 6 (six) hours. 09/09/15   Lavera Guiseana Duo Zana Biancardi, MD   BP 143/87 mmHg  Pulse 70  Temp(Src) 97.2 F (36.2 C) (Oral)  Resp 15  SpO2 100% Physical Exam Physical Exam  Nursing note and vitals  reviewed. Constitutional: non-toxic, and in no acute distress Head: Normocephalic and atraumatic.  Mouth/Throat: Oropharynx is clear and dry mucous membranes.  Neck: Normal range of motion. Neck supple.  Cardiovascular: Normal rate and regular rhythm.   Pulmonary/Chest: Effort normal and breath sounds normal.  Abdominal: Soft. There is no tenderness. There is no rebound and no guarding.  Musculoskeletal: Normal range of motion.  Neurological: Alert, no facial droop, fluent speech, moves all extremities symmetrically Skin: Skin is warm and dry.  Psychiatric: Cooperative  ED Course  Procedures (including critical care time) Labs Review Labs Reviewed  COMPREHENSIVE METABOLIC PANEL - Abnormal; Notable for the following:    CO2 21 (*)    Glucose, Bld 153 (*)    All other components within normal limits  CBC - Abnormal; Notable for the following:    MCHC 36.3 (*)    All other components within normal limits  URINALYSIS, ROUTINE W REFLEX MICROSCOPIC (NOT [facility]) - Abnormal; Notable for the following:    Ketones, ur >80 (*)    All other components within normal limits  LIPASE, BLOOD    Imaging Review No results found. I have personally reviewed and evaluated these images and lab results as part of my medical decision-making.   EKG Interpretation None      MDM   Final diagnoses:  Nausea vomiting and diarrhea    30 year old male who presents with nausea, vomiting, diarrhea since this morning. Vital signs within normal limits. He is a soft  and benign abdomen. Suspect that he likely has benign GI illness. Given Phenergan and IV fluids. Basic blood work including CBC, CMP, lipase and UA. Urinalysis with ketones. Basic blood work overall unremarkable. Overall clinical presentation is not concerning for serious intra-abdominal process. Given additional dose of Reglan after vomiting with Phenergan. Subsequently feels improved and able to tolerate by mouth intake. He felt comfortable  going home with continued symptomatic management. Discharged with Reglan and loperamide. Strict return and follow-up instructions reviewed. He expressed understanding of all discharge instructions and felt comfortable with the plan of care.     Lavera Guiseana Duo Renwick Asman, MD 09/09/15 [phone]

## 2015-09-09 NOTE — ED Notes (Signed)
Wife came out and reports that pt is feeling better and wants to go home. MD at bedside

## 2015-09-09 NOTE — ED Notes (Signed)
Pt woke up at 0500 this am with emesis. Threw up 5x prior to EMS arrival. EMS gave 4mg  zofran and 300ml NS. Pt still throwing up upon arrival to ED. One episode of diarrhea this am. Also having generalized abd cramping. No CP or sob. Wife had similar symptoms 2 weeks ago

## 2015-09-09 NOTE — Discharge Instructions (Signed)
Return without fail for worsening symptoms, including fever, intractable vomiting despite nausea medications, worsening pain, or any other symptoms concerning to you.  Diarrhea Diarrhea is frequent loose and watery bowel movements. It can cause you to feel weak and dehydrated. Dehydration can cause you to become tired and thirsty, have a dry mouth, and have decreased urination that often is dark yellow. Diarrhea is a sign of another problem, most often an infection that will not last long. In most cases, diarrhea typically lasts 2-3 days. However, it can last longer if it is a sign of something more serious. It is important to treat your diarrhea as directed by your caregiver to lessen or prevent future episodes of diarrhea. CAUSES  Some common causes include:  Gastrointestinal infections caused by viruses, bacteria, or parasites.  Food poisoning or food allergies.  Certain medicines, such as antibiotics, chemotherapy, and laxatives.  Artificial sweeteners and fructose.  Digestive disorders. HOME CARE INSTRUCTIONS  Ensure adequate fluid intake (hydration): Have 1 cup (8 oz) of fluid for each diarrhea episode. Avoid fluids that contain simple sugars or sports drinks, fruit juices, whole milk products, and sodas. Your urine should be clear or pale yellow if you are drinking enough fluids. Hydrate with an oral rehydration solution that you can purchase at pharmacies, retail stores, and online. You can prepare an oral rehydration solution at home by mixing the following ingredients together:   - tsp table salt.   tsp baking soda.   tsp salt substitute containing potassium chloride.  1  tablespoons sugar.  1 L (34 oz) of water.  Certain foods and beverages may increase the speed at which food moves through the gastrointestinal (GI) tract. These foods and beverages should be avoided and include:  Caffeinated and alcoholic beverages.  High-fiber foods, such as raw fruits and vegetables,  nuts, seeds, and whole grain breads and cereals.  Foods and beverages sweetened with sugar alcohols, such as xylitol, sorbitol, and mannitol.  Some foods may be well tolerated and may help thicken stool including:  Starchy foods, such as rice, toast, pasta, low-sugar cereal, oatmeal, grits, baked potatoes, crackers, and bagels.  Bananas.  Applesauce.  Add probiotic-rich foods to help increase healthy bacteria in the GI tract, such as yogurt and fermented milk products.  Wash your hands well after each diarrhea episode.  Only take over-the-counter or prescription medicines as directed by your caregiver.  Take a warm bath to relieve any burning or pain from frequent diarrhea episodes. SEEK IMMEDIATE MEDICAL CARE IF:   You are unable to keep fluids down.  You have persistent vomiting.  You have blood in your stool, or your stools are black and tarry.  You do not urinate in 6-8 hours, or there is only a small amount of very dark urine.  You have abdominal pain that increases or localizes.  You have weakness, dizziness, confusion, or light-headedness.  You have a severe headache.  Your diarrhea gets worse or does not get better.  You have a fever or persistent symptoms for more than 2-3 days.  You have a fever and your symptoms suddenly get worse. MAKE SURE YOU:   Understand these instructions.  Will watch your condition.  Will get help right away if you are not doing well or get worse.   This information is not intended to replace advice given to you by your health care provider. Make sure you discuss any questions you have with your health care provider.   Document Released: 02/16/2002 Document Revised:  03/19/2014 Document Reviewed: 11/04/2011 Elsevier Interactive Patient Education 2016 Else[employer]  Nausea and Vomiting Nausea is a sick feeling that often comes before throwing up (vomiting). Vomiting is a reflex where stomach contents come out of your mouth.  Vomiting can cause severe loss of body fluids (dehydration). Children and elderly adults can become dehydrated quickly, especially if they also have diarrhea. Nausea and vomiting are symptoms of a condition or disease. It is important to find the cause of your symptoms. CAUSES   Direct irritation of the stomach lining. This irritation can result from increased acid production (gastroesophageal reflux disease), infection, food poisoning, taking certain medicines (such as nonsteroidal anti-inflammatory drugs), alcohol use, or tobacco use.  Signals from the brain.These signals could be caused by a headache, heat exposure, an inner ear disturbance, increased pressure in the brain from injury, infection, a tumor, or a concussion, pain, emotional stimulus, or metabolic problems.  An obstruction in the gastrointestinal tract (bowel obstruction).  Illnesses such as diabetes, hepatitis, gallbladder problems, appendicitis, kidney problems, cancer, sepsis, atypical symptoms of a heart attack, or eating disorders.  Medical treatments such as chemotherapy and radiation.  Receiving medicine that makes you sleep (general anesthetic) during surgery. DIAGNOSIS Your caregiver may ask for tests to be done if the problems do not improve after a few days. Tests may also be done if symptoms are severe or if the reason for the nausea and vomiting is not clear. Tests may include:  Urine tests.  Blood tests.  Stool tests.  Cultures (to look for evidence of infection).  X-rays or other imaging studies. Test results can help your caregiver make decisions about treatment or the need for additional tests. TREATMENT You need to stay well hydrated. Drink frequently but in small amounts.You may wish to drink water, sports drinks, clear broth, or eat frozen ice pops or gelatin dessert to help stay hydrated.When you eat, eating slowly may help prevent nausea.There are also some antinausea medicines that may help  prevent nausea. HOME CARE INSTRUCTIONS   Take all medicine as directed by your caregiver.  If you do not have an appetite, do not force yourself to eat. However, you must continue to drink fluids.  If you have an appetite, eat a normal diet unless your caregiver tells you differently.  Eat a variety of complex carbohydrates (rice, wheat, potatoes, bread), lean meats, yogurt, fruits, and vegetables.  Avoid high-fat foods because they are more difficult to digest.  Drink enough water and fluids to keep your urine clear or pale yellow.  If you are dehydrated, ask your caregiver for specific rehydration instructions. Signs of dehydration may include:  Severe thirst.  Dry lips and mouth.  Dizziness.  Dark urine.  Decreasing urine frequency and amount.  Confusion.  Rapid breathing or pulse. SEEK IMMEDIATE MEDICAL CARE IF:   You have blood or brown flecks (like coffee grounds) in your vomit.  You have black or bloody stools.  You have a severe headache or stiff neck.  You are confused.  You have severe abdominal pain.  You have chest pain or trouble breathing.  You do not urinate at least once every 8 hours.  You develop cold or clammy skin.  You continue to vomit for longer than 24 to 48 hours.  You have a fever. MAKE SURE YOU:   Understand these instructions.  Will watch your condition.  Will get help right away if you are not doing well or get worse.   This information is not intended  to replace advice given to you by your health care provider. Make sure you discuss any questions you have with your health care provider.   Document Released: 02/26/2005 Document Revised: 05/21/2011 Document Reviewed: 07/26/2010 Elsevier Interactive Patient Education [employer]

## 2017-10-21 ENCOUNTER — Encounter (HOSPITAL_COMMUNITY): Payer: Self-pay | Admitting: *Deleted

## 2017-10-21 ENCOUNTER — Other Ambulatory Visit: Payer: Self-pay

## 2017-10-21 ENCOUNTER — Emergency Department (HOSPITAL_COMMUNITY)
Admission: EM | Admit: 2017-10-21 | Discharge: 2017-10-21 | Disposition: A | Discharge status: Home / Self Care | Payer: BLUE CROSS/BLUE SHIELD | Attending: Emergency Medicine | Admitting: Emergency Medicine

## 2017-10-21 DIAGNOSIS — T6191XA Toxic effect of unspecified seafood, accidental (unintentional), initial encounter: Secondary | ICD-10-CM | POA: Insufficient documentation

## 2017-10-21 DIAGNOSIS — R112 Nausea with vomiting, unspecified: Secondary | ICD-10-CM | POA: Insufficient documentation

## 2017-10-21 DIAGNOSIS — R739 Hyperglycemia, unspecified: Secondary | ICD-10-CM | POA: Insufficient documentation

## 2017-10-21 LAB — CBC
HEMATOCRIT: 43.5 % (ref 39.0–52.0)
HEMOGLOBIN: 15.1 g/dL (ref 13.0–17.0)
MCH: 30.8 pg (ref 26.0–34.0)
MCHC: 34.7 g/dL (ref 30.0–36.0)
MCV: 88.8 fL (ref 78.0–100.0)
Platelets: 237 10*3/uL (ref 150–400)
RBC: 4.9 MIL/uL (ref 4.22–5.81)
RDW: 13.7 % (ref 11.5–15.5)
WBC: 4.4 10*3/uL (ref 4.0–10.5)

## 2017-10-21 LAB — COMPREHENSIVE METABOLIC PANEL
ALT: 24 U/L (ref 0–44)
ANION GAP: 11 (ref 5–15)
AST: 31 U/L (ref 15–41)
Albumin: 4.5 g/dL (ref 3.5–5.0)
Alkaline Phosphatase: 69 U/L (ref 38–126)
BUN: 12 mg/dL (ref 6–20)
CO2: 21 mmol/L — AB (ref 22–32)
Calcium: 9.6 mg/dL (ref 8.9–10.3)
Chloride: 109 mmol/L (ref 98–111)
Creatinine, Ser: 0.95 mg/dL (ref 0.61–1.24)
GFR calc non Af Amer: >60 mL/min (ref >60)
Glucose, Bld: 172 mg/dL — ABNORMAL HIGH (ref 70–99)
POTASSIUM: 4.1 mmol/L (ref 3.5–5.1)
SODIUM: 141 mmol/L (ref 135–145)
Total Bilirubin: 0.7 mg/dL (ref 0.3–1.2)
Total Protein: 7.6 g/dL (ref 6.5–8.1)

## 2017-10-21 LAB — URINALYSIS, ROUTINE W REFLEX MICROSCOPIC
Bilirubin Urine: NEGATIVE
Glucose, UA: NEGATIVE mg/dL
HGB URINE DIPSTICK: NEGATIVE
Ketones, ur: 20 mg/dL — AB
Leukocytes, UA: NEGATIVE
Nitrite: NEGATIVE
Protein, ur: NEGATIVE mg/dL
SPECIFIC GRAVITY, URINE: 1.023 (ref 1.005–1.030)
pH: 8 (ref 5.0–8.0)

## 2017-10-21 LAB — LIPASE, BLOOD: Lipase: 24 U/L (ref 11–51)

## 2017-10-21 MED ORDER — SODIUM CHLORIDE 0.9 % IV BOLUS
2000.0000 mL | Freq: Once | INTRAVENOUS | Status: AC
  Administered 2017-10-21: 2000 mL via INTRAVENOUS

## 2017-10-21 MED ORDER — METHYLPREDNISOLONE SODIUM SUCC 125 MG IJ SOLR
125.0000 mg | Freq: Once | INTRAMUSCULAR | Status: AC
  Administered 2017-10-21: 125 mg via INTRAVENOUS
  Filled 2017-10-21: qty 2

## 2017-10-21 MED ORDER — ONDANSETRON 4 MG PO TBDP
4.0000 mg | ORAL_TABLET | Freq: Once | ORAL | Status: AC | PRN
  Administered 2017-10-21: 4 mg via ORAL
  Filled 2017-10-21: qty 1

## 2017-10-21 MED ORDER — DIPHENHYDRAMINE HCL 25 MG PO TABS: 25.0000 mg | ORAL_TABLET | Freq: Four times a day (QID) | ORAL | 0 refills | Status: AC | PRN

## 2017-10-21 MED ORDER — ONDANSETRON 4 MG PO TBDP: 4.0000 mg | ORAL_TABLET | Freq: Three times a day (TID) | ORAL | 0 refills | Status: AC | PRN

## 2017-10-21 MED ORDER — RANITIDINE HCL 150 MG PO TABS: 150.0000 mg | ORAL_TABLET | Freq: Two times a day (BID) | ORAL | 0 refills | Status: AC

## 2017-10-21 MED ORDER — FAMOTIDINE IN NACL 20-0.9 MG/50ML-% IV SOLN
20.0000 mg | Freq: Once | INTRAVENOUS | Status: AC
  Administered 2017-10-21: 20 mg via INTRAVENOUS
  Filled 2017-10-21: qty 50

## 2017-10-21 MED ORDER — PREDNISONE 20 MG PO TABS: ORAL_TABLET | ORAL | 0 refills | Status: AC

## 2017-10-21 NOTE — ED Notes (Signed)
Pt noted to have dry heaving .

## 2017-10-21 NOTE — ED Triage Notes (Addendum)
Pt presents with emesis x 4 hours.  Pt reports having fevers and chills.  Pt denies any pain.  Pt has been unable to tolerate PO fluids.  Pt a/o x 4 and ambulatory. Pt states his arms are tingling and thinks he may be dehydrated.

## 2017-10-21 NOTE — Discharge Instructions (Addendum)
Use zofran as prescribed, as needed for nausea. Take prednisone as directed until completed, starting tomorrow since you received today's dose here today. Take zantac as directed, starting tonight with dinner. Use benadryl as directed as needed for additional relief of symptoms. Alternate between tylenol and motrin as needed for pain. May consider using over the counter tums, maalox, pepto bismol, or other over the counter remedies to help with symptoms. Stay well hydrated with small sips of fluids throughout the day. Avoid known triggers. Also, your blood sugar was elevated, please follow up with your regular doctor regarding this finding, to ensure that this is not a persistent issue. Follow up with your regular doctor in 1 week for recheck of symptoms. Return to ER for changing or worsening of symptoms.

## 2017-10-21 NOTE — ED Provider Notes (Signed)
[facility]-EMERGENCY DEPT Provider Note   CSN: [id number] Arrival date & time: 10/21/17  1201     History   Chief Complaint Chief Complaint  Patient presents with  . Emesis    HPI Dustin Torres is a 32 y.o. otherwise healthy male, who presents to the ED with complaints of chills, nausea, and vomiting that began about 4 hours ago, around 9 AM.  Patient states that he ate seafood last night and that this feels similar to when he had an allergic reaction to a bee sting last year.  He states he has had too numerous to count episodes of nonbloody nonbilious emesis with associated nausea and chills.  He tried taking Benadryl this morning which did not help much.  Trying to eat or drink anything makes his symptoms worse.  He was given Zofran upon arrival and states that this helped.  He admits that he had 2 shots of liquor last night.  He denies any rashes, itching, facial swelling, drooling, difficulty swallowing or breathing, fevers, CP, SOB, abdominal pain, hematemesis, diarrhea, constipation, melena, hematochezia, dysuria, hematuria, myalgias, arthralgias, numbness, tingling, focal weakness, or any other complaints at this time.  He denies recent travel, sick contacts, NSAID use, or prior abd surgeries.  He denies having any medical problems.    The history is provided by the patient and medical records. No language interpreter was used.  Emesis   Associated symptoms include chills. Pertinent negatives include no abdominal pain, no arthralgias, no diarrhea, no fever and no myalgias.    Past Medical History:  Diagnosis Date  . Medical history non-contributory     Patient Active Problem List   Diagnosis Date Noted  . Intractable nausea and vomiting 08/05/2012  . Diarrhea 08/05/2012    Past Surgical History:  Procedure Laterality Date  . KNEE ARTHROSCOPY     2 surgeries on the left, 1 surgery on the right        Home Medications    Prior to Admission  medications   Medication Sig Start Date End Date Taking? Authorizing Provider  loperamide (IMODIUM) 2 MG capsule Take 1 capsule (2 mg total) by mouth 4 (four) times daily as needed for diarrhea or loose stools. 09/09/15   Lavera Guise, MD  metoCLOPramide (REGLAN) 10 MG tablet Take 1 tablet (10 mg total) by mouth every 6 (six) hours. 09/09/15   Lavera Guise, MD    Family History No family history on file.  Social History Social History   Tobacco Use  . Smoking status: Never Smoker  . Smokeless tobacco: Never Used  Substance Use Topics  . Alcohol use: No  . Drug use: No     Allergies   Patient has no known allergies.   Review of Systems Review of Systems  Constitutional: Positive for chills. Negative for fever.  HENT: Negative for drooling, facial swelling and trouble swallowing.   Respiratory: Negative for shortness of breath.   Cardiovascular: Negative for chest pain.  Gastrointestinal: Positive for nausea and vomiting. Negative for abdominal pain, blood in stool, constipation and diarrhea.  Genitourinary: Negative for dysuria and hematuria.  Musculoskeletal: Negative for arthralgias and myalgias.  Skin: Negative for color change and rash.  Allergic/Immunologic: Negative for immunocompromised state.  Neurological: Negative for weakness and numbness.  Psychiatric/Behavioral: Negative for confusion.   All other systems reviewed and are negative for acute change except as noted in the HPI.    Physical Exam Updated Vital Signs BP (!) 148/87 (BP Location:  Right Arm)   Pulse 86   Temp 97.6 F (36.4 C) (Oral)   Resp 18   Ht 6\' 4"  (1.93 m)   Wt 113.4 kg   SpO2 100%   BMI 30.43 kg/m   Physical Exam  Constitutional: He is oriented to person, place, and time. Vital signs are normal. He appears well-developed and well-nourished.  Non-toxic appearance. No distress.  Afebrile, nontoxic, NAD  HENT:  Head: Normocephalic and atraumatic.  Mouth/Throat: Oropharynx is clear and  moist and mucous membranes are normal.  No facial swelling, handling secretions well, airway patent  Eyes: Conjunctivae and EOM are normal. Right eye exhibits no discharge. Left eye exhibits no discharge.  Neck: Normal range of motion. Neck supple.  Cardiovascular: Normal rate, regular rhythm, normal heart sounds and intact distal pulses. Exam reveals no gallop and no friction rub.  No murmur heard. Pulmonary/Chest: Effort normal and breath sounds normal. No respiratory distress. He has no decreased breath sounds. He has no wheezes. He has no rhonchi. He has no rales.  Abdominal: Soft. Normal appearance and bowel sounds are normal. He exhibits no distension. There is no tenderness. There is no rigidity, no rebound, no guarding, no CVA tenderness, no tenderness at McBurney's point and negative Murphy's sign.  Soft, NTND, +BS throughout, no r/g/r, neg murphy's, neg mcburney's, no CVA TTP   Musculoskeletal: Normal range of motion.  Neurological: He is alert and oriented to person, place, and time. He has normal strength. No sensory deficit.  Skin: Skin is warm, dry and intact. No rash noted.  Psychiatric: He has a normal mood and affect.  Nursing note and vitals reviewed.    ED Treatments / Results  Labs (all labs ordered are listed, but only abnormal results are displayed) Labs Reviewed  COMPREHENSIVE METABOLIC PANEL - Abnormal; Notable for the following components:      Result Value   CO2 21 (*)    Glucose, Bld 172 (*)    All other components within normal limits  URINALYSIS, ROUTINE W REFLEX MICROSCOPIC - Abnormal; Notable for the following components:   Ketones, ur 20 (*)    All other components within normal limits  LIPASE, BLOOD  CBC    EKG None  Radiology No results found.  Procedures Procedures (including critical care time)  Medications Ordered in ED Medications  ondansetron (ZOFRAN-ODT) disintegrating tablet 4 mg (4 mg Oral Given 10/21/17 1237)  methylPREDNISolone  sodium succinate (SOLU-MEDROL) 125 mg/2 mL injection 125 mg (125 mg Intravenous Given 10/21/17 1347)  famotidine (PEPCID) IVPB 20 mg premix (0 mg Intravenous Stopped 10/21/17 1442)  sodium chloride 0.9 % bolus 2,000 mL (0 mLs Intravenous Stopped 10/21/17 1530)     Initial Impression / Assessment and Plan / ED Course  I have reviewed the triage vital signs and the nursing notes.  Pertinent labs & imaging results that were available during my care of the patient were reviewed by me and considered in my medical decision making (see chart for details).     32 y.o. male here with n/v and chills since 9am (4hrs), states it feels like prior allergic reaction to bee stings. Ate seafood last night. On exam, no abdominal tenderness, VSS, in NAD, airway patent without any facial swelling. Labs drawn and pending. Pt given zofran ODT and states this helped. Will treat as possible allergic reaction, give fluids/solumedrol/pepcid, and await labs then reassess shortly. Doubt need for imaging.   4:42 PM CBC WNL. CMP with gluc 172 which could just be from  how much vomiting he's had (stress surge), he's had some elevated sugar levels in the past, advised f/up with PCP to ensure this is only a transient thing. Lipase WNL. U/A with a few ketones but otherwise WNL, no findings for UTI or other concerning findings. Pt feeling better and tolerating PO well now. Hard to say if this was food poisoning vs allergy, but no anaphylaxis type reaction reported or noted today. Will send home with benadryl/zantac/prednisone, and zofran. Advised avoidance of triggers, adequate hydration and rest. F/up with PCP in 1wk for recheck of symptoms. I explained the diagnosis and have given explicit precautions to return to the ER including for any other new or worsening symptoms. The patient understands and accepts the medical plan as it's been dictated and I have answered their questions. Discharge instructions concerning home care and  prescriptions have been given. The patient is STABLE and is discharged to home in good condition.    Final Clinical Impressions(s) / ED Diagnoses   Final diagnoses:  Nausea and vomiting in adult patient  Hyperglycemia, unspecified  Toxic effect of seafood, unintentional, initial encounter    ED Discharge Orders         Ordered    ondansetron (ZOFRAN ODT) 4 MG disintegrating tablet  Every 8 hours PRN     10/21/17 1642    predniSONE (DELTASONE) 20 MG tablet     10/21/17 1642    ranitidine (ZANTAC) 150 MG tablet  2 times daily     10/21/17 1642    diphenhydrAMINE (BENADRYL) 25 MG tablet  Every 6 hours PRN     10/21/17 [address]1642           Shaune Westfall, [place] Point[place]cedes, [place] JerseyPA-C 10/21/17 1643    Tilden Fossaees, Elizabeth, MD 10/23/17 1345

## 2017-10-23 ENCOUNTER — Encounter (HOSPITAL_COMMUNITY): Payer: Self-pay

## 2017-10-23 ENCOUNTER — Other Ambulatory Visit: Payer: Self-pay

## 2017-10-23 ENCOUNTER — Emergency Department (HOSPITAL_COMMUNITY)
Admission: EM | Admit: 2017-10-23 | Discharge: 2017-10-23 | Disposition: A | Discharge status: Home / Self Care | Payer: BLUE CROSS/BLUE SHIELD | Attending: Emergency Medicine | Admitting: Emergency Medicine

## 2017-10-23 DIAGNOSIS — R1110 Vomiting, unspecified: Secondary | ICD-10-CM | POA: Diagnosis present

## 2017-10-23 DIAGNOSIS — Z79899 Other long term (current) drug therapy: Secondary | ICD-10-CM | POA: Insufficient documentation

## 2017-10-23 LAB — COMPREHENSIVE METABOLIC PANEL
ALT: 34 U/L (ref 0–44)
ANION GAP: 11 (ref 5–15)
AST: 36 U/L (ref 15–41)
Albumin: 4.4 g/dL (ref 3.5–5.0)
Alkaline Phosphatase: 55 U/L (ref 38–126)
BUN: 16 mg/dL (ref 6–20)
CHLORIDE: 105 mmol/L (ref 98–111)
CO2: 25 mmol/L (ref 22–32)
CREATININE: 1.1 mg/dL (ref 0.61–1.24)
Calcium: 9.3 mg/dL (ref 8.9–10.3)
Glucose, Bld: 121 mg/dL — ABNORMAL HIGH (ref 70–99)
POTASSIUM: 4.4 mmol/L (ref 3.5–5.1)
SODIUM: 141 mmol/L (ref 135–145)
Total Bilirubin: 1 mg/dL (ref 0.3–1.2)
Total Protein: 7.3 g/dL (ref 6.5–8.1)

## 2017-10-23 LAB — CBC WITH DIFFERENTIAL/PLATELET
BASOS ABS: 0 10*3/uL (ref 0.0–0.1)
Basophils Relative: 0 %
EOS PCT: 0 %
Eosinophils Absolute: 0 10*3/uL (ref 0.0–0.7)
HCT: 44.2 % (ref 39.0–52.0)
HEMOGLOBIN: 15.3 g/dL (ref 13.0–17.0)
LYMPHS PCT: 22 %
Lymphs Abs: 1.6 10*3/uL (ref 0.7–4.0)
MCH: 30.9 pg (ref 26.0–34.0)
MCHC: 34.6 g/dL (ref 30.0–36.0)
MCV: 89.3 fL (ref 78.0–100.0)
Monocytes Absolute: 0.6 10*3/uL (ref 0.1–1.0)
Monocytes Relative: 9 %
NEUTROS ABS: 5.1 10*3/uL (ref 1.7–7.7)
NEUTROS PCT: 69 %
PLATELETS: 231 10*3/uL (ref 150–400)
RBC: 4.95 MIL/uL (ref 4.22–5.81)
RDW: 14.1 % (ref 11.5–15.5)
WBC: 7.3 10*3/uL (ref 4.0–10.5)

## 2017-10-23 LAB — LIPASE, BLOOD: LIPASE: 25 U/L (ref 11–51)

## 2017-10-23 MED ORDER — ONDANSETRON HCL 4 MG/2ML IJ SOLN
4.0000 mg | Freq: Once | INTRAMUSCULAR | Status: AC
  Administered 2017-10-23: 4 mg via INTRAVENOUS
  Filled 2017-10-23: qty 2

## 2017-10-23 MED ORDER — PANTOPRAZOLE SODIUM 40 MG PO TBEC: 40.0000 mg | DELAYED_RELEASE_TABLET | Freq: Every day | ORAL | 0 refills | Status: AC

## 2017-10-23 MED ORDER — PROMETHAZINE HCL 25 MG PO TABS: 25.0000 mg | ORAL_TABLET | Freq: Four times a day (QID) | ORAL | 0 refills | Status: AC | PRN

## 2017-10-23 MED ORDER — SODIUM CHLORIDE 0.9 % IV BOLUS
1000.0000 mL | Freq: Once | INTRAVENOUS | Status: AC
  Administered 2017-10-23: 1000 mL via INTRAVENOUS

## 2017-10-23 NOTE — ED Triage Notes (Signed)
Pt reports that he was here 2 days ago and was seen for the same thing. Pt reports that yesterday symptoms seemed to be better but he began having symptoms this morning. N/V, chills and abd pain. Pt and family unsure if pt has a food allergy or if the fatty food he had for dinner is causing these symptoms.

## 2017-10-23 NOTE — Discharge Instructions (Signed)
If you develop abdominal pain, fever, or recurrent vomiting, or any other new/concerning symptoms and return to the ER for evaluation.  Otherwise follow-up with a primary care physician for outpatient follow-up and primary care.

## 2017-10-23 NOTE — ED Provider Notes (Signed)
[facility]-EMERGENCY DEPT Provider Note   CSN: [id number] Arrival date & time: 10/23/17  0702     History   Chief Complaint Chief Complaint  Patient presents with  . Emesis  . Chills    HPI Dustin Torres is a 32 y.o. male.  HPI  32 year old male presents with recurrent vomiting.  He was seen here 2 days ago for the same.  He states at that time he had presented a few hours after starting to vomit all of a sudden.  He felt better after treatment and work-up in the emergency department and went home.  He did okay yesterday but did not eat anything until the evening.  He then tried to eat watermelon and some hamburger helper.  Did fine until waking up at 530 with recurrent vomiting.  He had diarrhea the first day but has not had any diarrhea currently.  The emesis is clear and yellow without any blood.  No fevers but is having some chills.  During the active vomiting he has some upper abdominal pain but currently has no abdominal pain.  He did take a Zofran that he was prescribed and is feeling somewhat better but his mouth is very dry.  No fevers, recent travel or camping.  No antibiotics recently.  Past Medical History:  Diagnosis Date  . Medical history non-contributory     Patient Active Problem List   Diagnosis Date Noted  . Intractable nausea and vomiting 08/05/2012  . Diarrhea 08/05/2012    Past Surgical History:  Procedure Laterality Date  . KNEE ARTHROSCOPY     2 surgeries on the left, 1 surgery on the right        Home Medications    Prior to Admission medications   Medication Sig Start Date End Date Taking? Authorizing Provider  celecoxib (CELEBREX) 100 MG capsule Take 100 mg by mouth daily. 08/22/17  Yes [provider]  diphenhydrAMINE (BENADRYL) 25 MG tablet Take 1 tablet (25 mg total) by mouth every 6 (six) hours as needed for itching or allergies. 10/21/17  Yes Street, [place], PA-C  Multiple Vitamins-Minerals (MENS ONE DAILY  PO) Take 1 tablet by mouth daily.   Yes [provider]  ondansetron (ZOFRAN ODT) 4 MG disintegrating tablet Take 1 tablet (4 mg total) by mouth every 8 (eight) hours as needed for nausea or vomiting. 10/21/17  Yes Street, [place], PA-C  predniSONE (DELTASONE) 20 MG tablet 3 tabs po daily x 4 days STARTING 10/22/17 10/21/17  Yes Street, [place], PA-C  ranitidine (ZANTAC) 150 MG tablet Take 1 tablet (150 mg total) by mouth 2 (two) times daily for 5 days. STARTING TONIGHT WITH DINNER 10/21/17 10/26/17 Yes Street, [place], PA-C  loperamide (IMODIUM) 2 MG capsule Take 1 capsule (2 mg total) by mouth 4 (four) times daily as needed for diarrhea or loose stools. Patient not taking: Reported on 10/21/2017 09/09/15   Lavera GuiseLiu, Dana Duo, MD  metoCLOPramide (REGLAN) 10 MG tablet Take 1 tablet (10 mg total) by mouth every 6 (six) hours. Patient not taking: Reported on 10/21/2017 09/09/15   Lavera GuiseLiu, Dana Duo, MD  pantoprazole (PROTONIX) 40 MG tablet Take 1 tablet (40 mg total) by mouth daily. 10/23/17   Pricilla LovelessGoldston, Zorana Brockwell, MD  promethazine (PHENERGAN) 25 MG tablet Take 1 tablet (25 mg total) by mouth every 6 (six) hours as needed for nausea or vomiting. 10/23/17   Pricilla LovelessGoldston, Unita Detamore, MD    Family History History reviewed. No pertinent family history.  Social History Social History  Tobacco Use  . Smoking status: Never Smoker  . Smokeless tobacco: Never Used  Substance Use Topics  . Alcohol use: No  . Drug use: No     Allergies   Patient has no known allergies.   Review of Systems Review of Systems  Constitutional: Positive for chills. Negative for fever.  Gastrointestinal: Positive for abdominal pain, nausea and vomiting. Negative for diarrhea.  Genitourinary: Negative for dysuria.  Musculoskeletal: Negative for back pain.  All other systems reviewed and are negative.    Physical Exam Updated Vital Signs BP 130/72   Pulse (!) 52   Temp 98 F (36.7 C) (Oral)   Resp 18   Wt 113.4 kg   SpO2 97%    BMI 30.43 kg/m   Physical Exam  Constitutional: He is oriented to person, place, and time. He appears well-developed and well-nourished. No distress.  HENT:  Head: Normocephalic and atraumatic.  Right Ear: External ear normal.  Left Ear: External ear normal.  Nose: Nose normal.  Eyes: Right eye exhibits no discharge. Left eye exhibits no discharge.  Neck: Neck supple.  Cardiovascular: Normal rate, regular rhythm and normal heart sounds.  Pulmonary/Chest: Effort normal and breath sounds normal.  Abdominal: Soft. He exhibits no distension. There is no tenderness.  Musculoskeletal: He exhibits no edema.  Neurological: He is alert and oriented to person, place, and time.  Skin: Skin is warm and dry. He is not diaphoretic.  Nursing note and vitals reviewed.    ED Treatments / Results  Labs (all labs ordered are listed, but only abnormal results are displayed) Labs Reviewed  COMPREHENSIVE METABOLIC PANEL - Abnormal; Notable for the following components:      Result Value   Glucose, Bld 121 (*)    All other components within normal limits  LIPASE, BLOOD  CBC WITH DIFFERENTIAL/PLATELET    EKG None  Radiology No results found.  Procedures Procedures (including critical care time)  Medications Ordered in ED Medications  sodium chloride 0.9 % bolus 1,000 mL (1,000 mLs Intravenous New Bag/Given 10/23/17 0810)  ondansetron (ZOFRAN) injection 4 mg (4 mg Intravenous Given 10/23/17 0809)     Initial Impression / Assessment and Plan / ED Course  I have reviewed the triage vital signs and the nursing notes.  Pertinent labs & imaging results that were available during my care of the patient were reviewed by me and considered in my medical decision making (see chart for details).     Patient feels better after treatment.  He has a benign abdominal exam including no right lower quadrant tenderness.  His lab work is essentially stable with no WBC elevation or significant  electrolyte disturbance.  I do not think imaging would be beneficial as this is probably still gastroenteritis.  He probably started eating a little too soon causing him to have recurrent vomiting.  I will prescribe Phenergan in addition to Zofran at home and discussed rest.  Otherwise he appears stable for discharge home with return precautions.  Final Clinical Impressions(s) / ED Diagnoses   Final diagnoses:  Vomiting in adult    ED Discharge Orders         Ordered    promethazine (PHENERGAN) 25 MG tablet  Every 6 hours PRN     10/23/17 0854    pantoprazole (PROTONIX) 40 MG tablet  Daily     10/23/17 0859           Pricilla LovelessGoldston, Aunna Snooks, MD 10/23/17 [phone]

## 2017-10-23 NOTE — ED Notes (Signed)
Pt ambulated to bathroom without assistance or calling out for help. Pts significant other at bedside assisting pt. Will continue to monitor.
